# Patient Record
Sex: Female | Born: 2000 | Race: White | Hispanic: Yes | Marital: Single | Smoking: Never smoker
Health system: Southern US, Community
[De-identification: ages and names within clinical notes are randomized; demographics above are authoritative.]

---
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
## 2019-12-02 IMAGING — MR MR LUMBAR SPINE WO/W CM
4 of 7 series · 24 of 48 positions shown · IV contrast (Multihance)
Comparison: None.

CLINICAL DATA: Back pain with decreased sensation in the left lower
extremity.

EXAM:
MRI LUMBAR SPINE WITHOUT AND WITH CONTRAST
TECHNIQUE: Multiplanar and multiecho pulse sequences of the lumbar spine were
obtained without and with intravenous contrast.
CONTRAST:  20mL MULTIHANCE GADOBENATE DIMEGLUMINE 529 MG/ML IV SOLN

[Series 9: T2 · sagittal · 4.0mm · 0.73mm/px · 5 of 16 slices shown (1 of 2)]
[im 1/16]
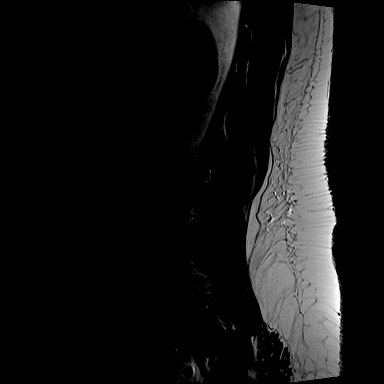
[im 4/16]
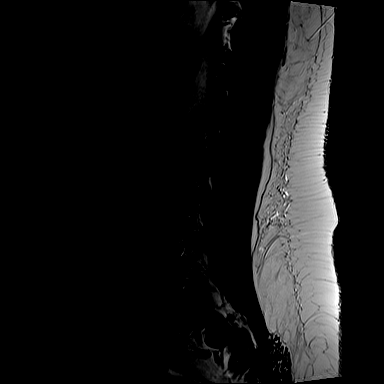
[im 8/16]
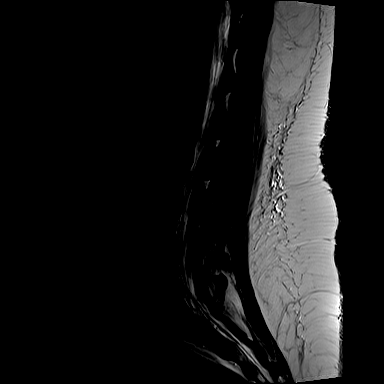
[im 12/16]
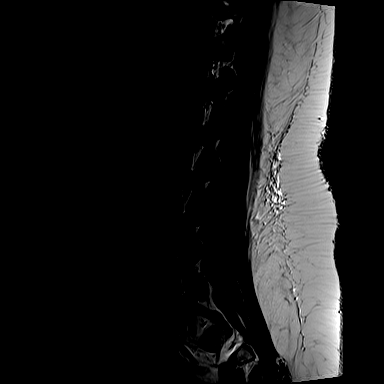
[im 16/16]
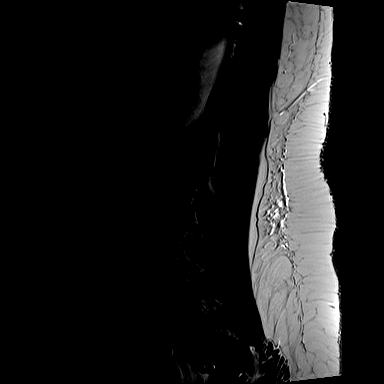

[Series 11: T1 · sagittal · 4.0mm · 0.88mm/px · 4 of 15 slices shown (1 of 2)]
[im 1/15]
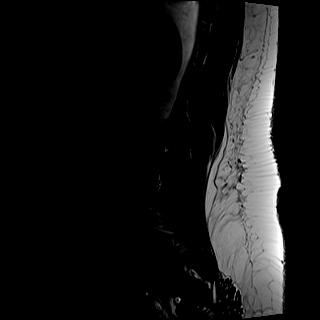
[im 5/15]
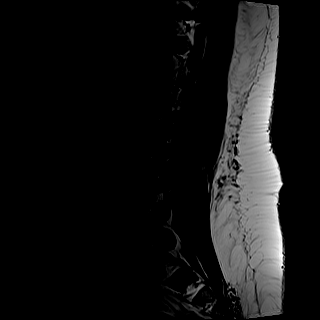
[im 10/15]
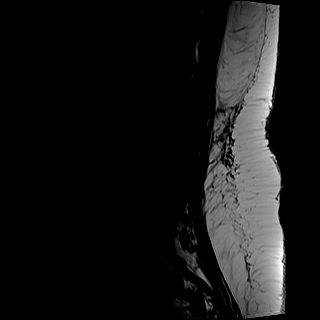
[im 15/15]
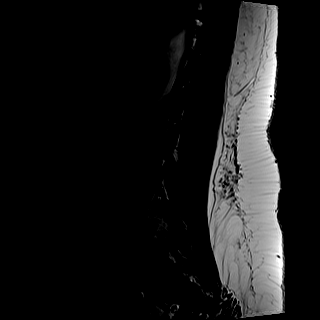

[Series 12: T2 · axial · 4.0mm · 0.57mm/px · z∈[-228,+0]mm · 8 of 38 slices shown (2 of 2)]
[im 1/38]
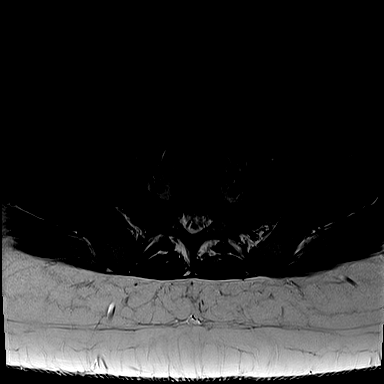
[im 5/38]
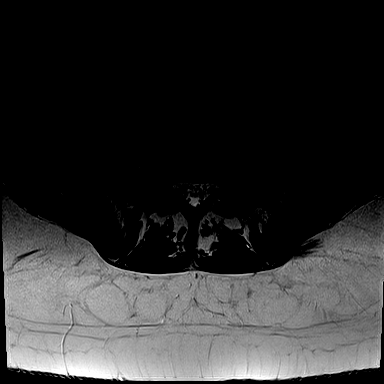
[im 13/38]
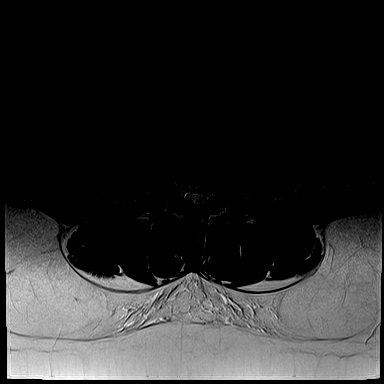
[im 17/38]
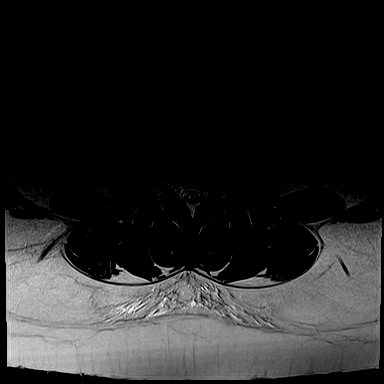
[im 21/38]
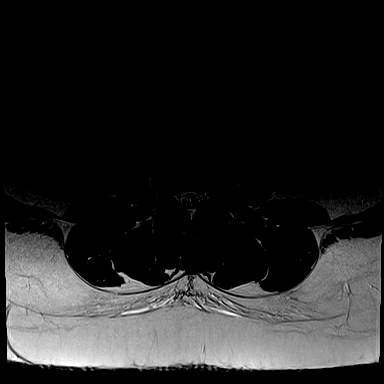
[im 25/38]
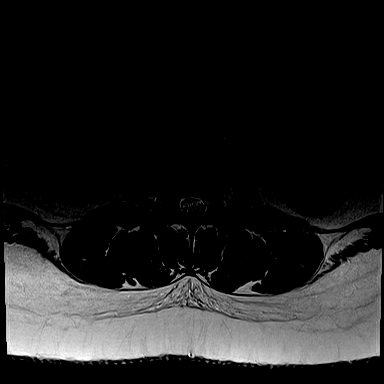
[im 33/38]
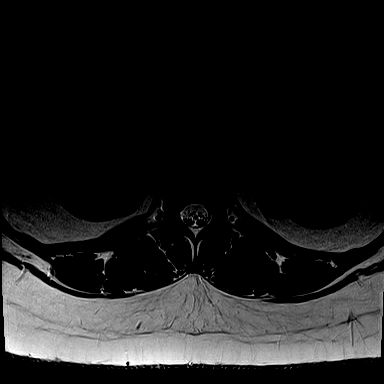
[im 38/38]
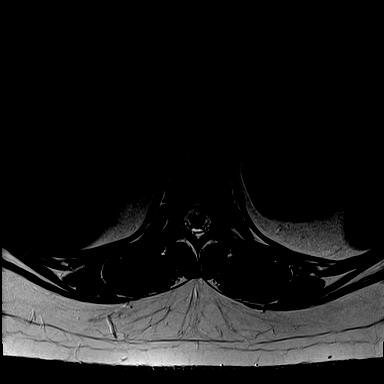

[Series 13: T1 · axial · 4.0mm · 0.34mm/px · z∈[-228,-24]mm · 7 of 38 slices shown (2 of 2)]
[im 1/38]
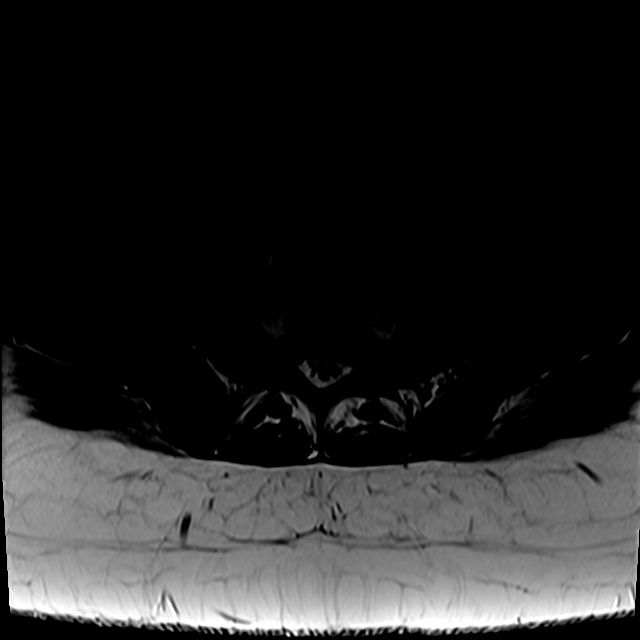
[im 5/38]
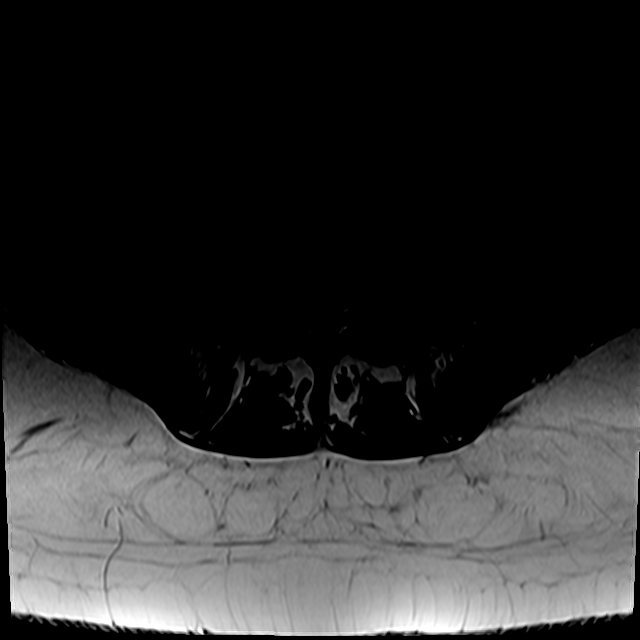
[im 13/38]
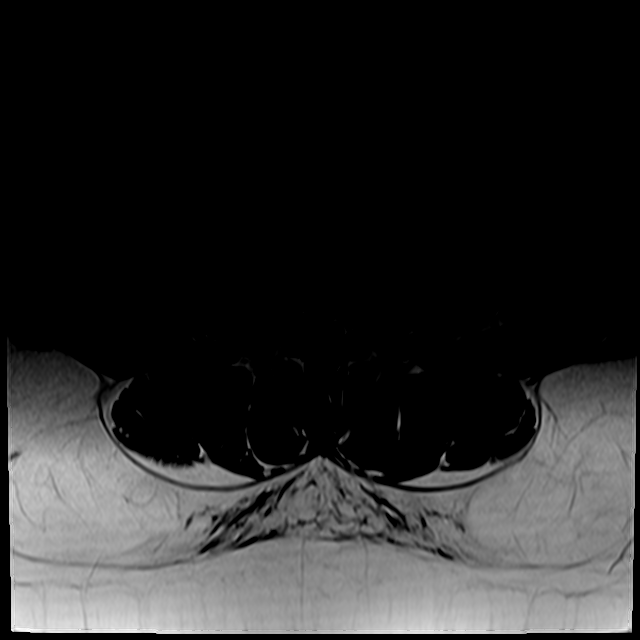
[im 17/38]
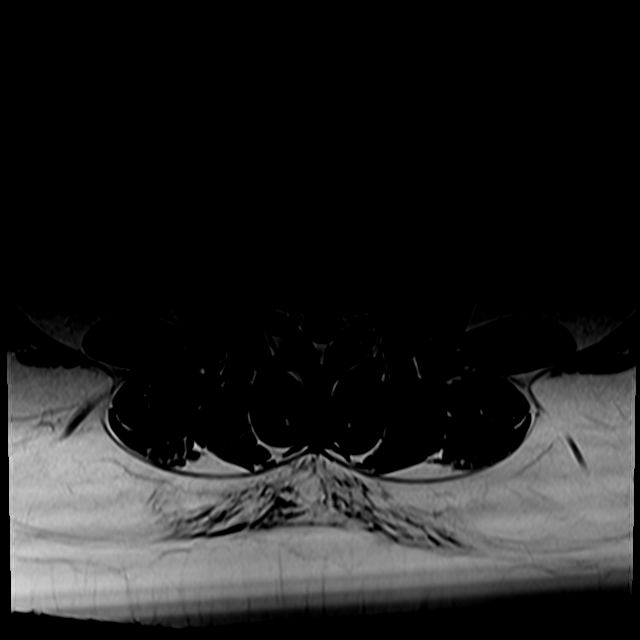
[im 21/38]
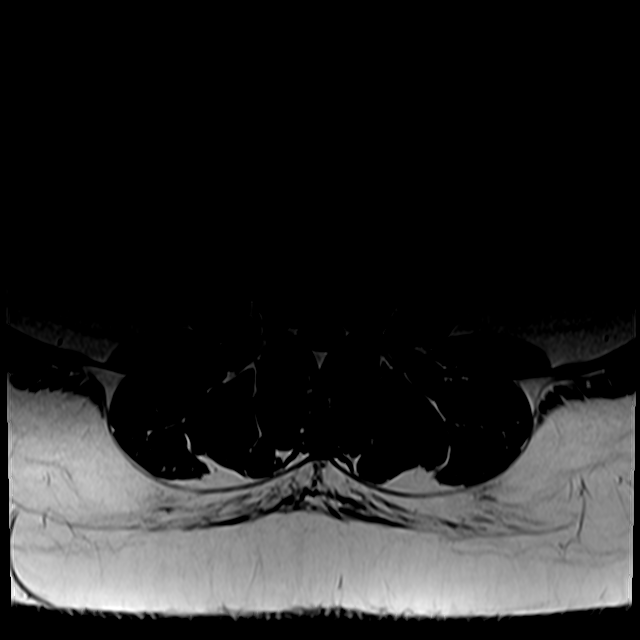
[im 25/38]
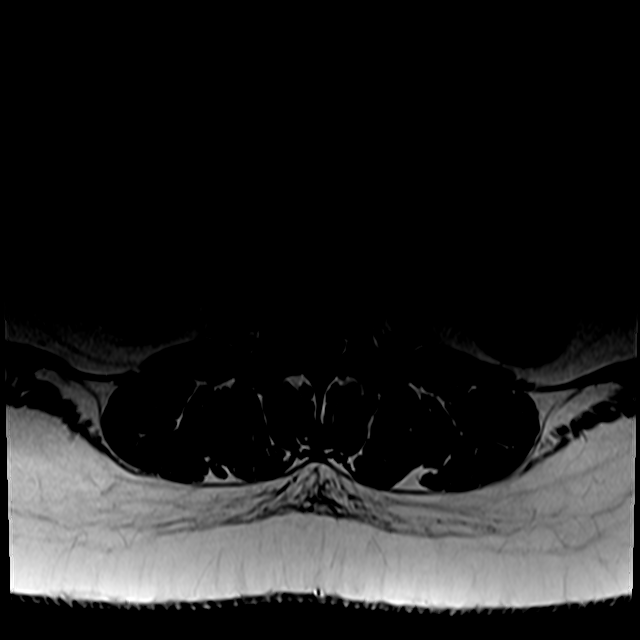
[im 33/38]
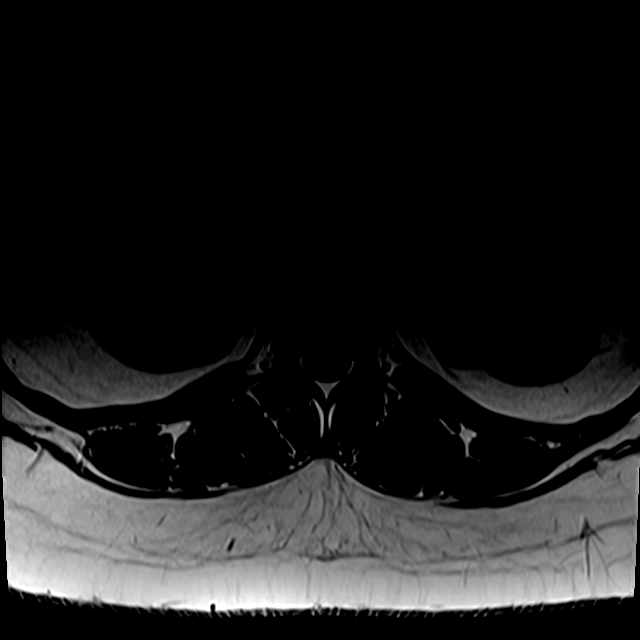

[24 of 48 positions shown; findings below may reference images not displayed]

FINDINGS: Segmentation: Normal. The lowest disc space is considered to be
L5-S1.

Alignment:  Normal

Vertebrae: No acute compression fracture, discitis-osteomyelitis of
focal marrow lesion.

Conus medullaris and cauda equina: The conus medullaris terminates
at the L1 level. The cauda equina and conus medullaris are both
normal.

Paraspinal and other soft tissues: The visualized aorta, IVC and
iliac vessels are normal. The visualized retroperitoneal organs and
paraspinal soft tissues are normal.

Disc levels:

The T11-L4 disc levels are normal.

L4-L5: Small central disc protrusion and annular fissure without
spinal canal or neural foraminal stenosis.

L5-S1: Normal disc space. No spinal canal or neural foraminal
stenosis.

No abnormal contrast enhancement.
IMPRESSION: Small central disc protrusion at L4-5 without stenosis. Otherwise
normal lumbar spine.
